# Patient Record
Sex: Female | Born: 1995 | Race: White | Hispanic: No | Marital: Married
Health system: Southern US, Community
[De-identification: ages and names within clinical notes are randomized; demographics above are authoritative.]

---

## 2019-09-05 ENCOUNTER — Emergency Department (HOSPITAL_COMMUNITY)

## 2019-09-05 ENCOUNTER — Other Ambulatory Visit: Payer: Self-pay

## 2019-09-05 ENCOUNTER — Emergency Department (HOSPITAL_COMMUNITY)
Admission: EM | Admit: 2019-09-05 | Discharge: 2019-09-06 | Disposition: A | Discharge status: Home / Self Care | Attending: Emergency Medicine | Admitting: Emergency Medicine

## 2019-09-05 DIAGNOSIS — Y92000 Kitchen of unspecified non-institutional (private) residence as  the place of occurrence of the external cause: Secondary | ICD-10-CM | POA: Insufficient documentation

## 2019-09-05 DIAGNOSIS — Y93G1 Activity, food preparation and clean up: Secondary | ICD-10-CM | POA: Diagnosis not present

## 2019-09-05 DIAGNOSIS — Z23 Encounter for immunization: Secondary | ICD-10-CM | POA: Insufficient documentation

## 2019-09-05 DIAGNOSIS — S61211A Laceration without foreign body of left index finger without damage to nail, initial encounter: Secondary | ICD-10-CM | POA: Insufficient documentation

## 2019-09-05 DIAGNOSIS — Y999 Unspecified external cause status: Secondary | ICD-10-CM | POA: Diagnosis not present

## 2019-09-05 DIAGNOSIS — Y281XXA Contact with knife, undetermined intent, initial encounter: Secondary | ICD-10-CM | POA: Insufficient documentation

## 2019-09-05 MED ORDER — TETANUS-DIPHTH-ACELL PERTUSSIS 5-2.5-18.5 LF-MCG/0.5 IM SUSP
0.5000 mL | Freq: Once | INTRAMUSCULAR | Status: AC
  Administered 2019-09-05: 0.5 mL via INTRAMUSCULAR
  Filled 2019-09-05: qty 0.5

## 2019-09-05 MED ORDER — BACITRACIN ZINC 500 UNIT/GM EX OINT
TOPICAL_OINTMENT | Freq: Once | CUTANEOUS | Status: AC
  Administered 2019-09-05: 1 via TOPICAL
  Filled 2019-09-05: qty 0.9

## 2019-09-05 MED ORDER — LIDOCAINE HCL (PF) 1 % IJ SOLN
10.0000 mL | Freq: Once | INTRAMUSCULAR | Status: AC
  Administered 2019-09-05: 10 mL via INTRADERMAL
  Filled 2019-09-05: qty 10

## 2019-09-05 NOTE — ED Provider Notes (Signed)
[facility] EMERGENCY DEPARTMENT Provider Note   CSN: [id number] Arrival date & time: 09/05/19  2048     History Chief Complaint  Patient presents with  . Finger Injury    Joan Hanson is a 24 y.o. female who presents for evaluation of left index finger laceration that occurred just prior to arrival. She reports she was cutting an avocado and the knife slipped and cut her finger. She is not on any blood thinners.  She denies any numbness/weakness.  The history is provided by the patient.       No past medical history on file.  There are no problems to display for this patient.   The histories are not reviewed yet. Please review them in the "History" navigator section and refresh this SmartLink.   OB History   No obstetric history on file.     No family history on file.  Social History   Tobacco Use  . Smoking status: Not on file  Substance Use Topics  . Alcohol use: Not on file  . Drug use: Not on file    Home Medications Prior to Admission medications   Medication Sig Start Date End Date Taking? Authorizing Provider  Prenatal Vit-Fe Fumarate-FA (PRENATAL PO) Take 1 tablet by mouth at bedtime.   Yes [provider]    Allergies    Patient has no known allergies.  Review of Systems   Review of Systems  Skin: Positive for wound.  Neurological: Negative for weakness and numbness.  All other systems reviewed and are negative.   Physical Exam Updated Vital Signs BP (!) 145/78 (BP Location: Right Arm)   Pulse 80   Temp 97.9 F (36.6 C) (Oral)   Resp 18   SpO2 99%   Physical Exam Vitals and nursing note reviewed.  Constitutional:      Appearance: She is well-developed.  HENT:     Head: Normocephalic and atraumatic.  Eyes:     General: No scleral icterus.       Right eye: No discharge.        Left eye: No discharge.     Conjunctiva/sclera: Conjunctivae normal.  Cardiovascular:     Pulses:          Radial pulses  are 2+ on the right side and 2+ on the left side.  Pulmonary:     Effort: Pulmonary effort is normal.  Musculoskeletal:     Comments: Flexion/tension to the left index finger without any difficulty.  Full flexion and extension of the PIP and DIP intact with any difficulty.  She can easily make a fist.  Skin:    General: Skin is warm and dry.     Comments: Small 2 cm v shaped laceration noted to the volar and lateral aspect of the left index finger that is in between MCP and PIP. It does not cross over the joint.   Neurological:     Mental Status: She is alert.     Comments: Sensation intact along major nerve distributions of BUE  Psychiatric:        Speech: Speech normal.        Behavior: Behavior normal.     ED Results / Procedures / Treatments   Labs (all labs ordered are listed, but only abnormal results are displayed) Labs Reviewed - No data to display  EKG None  Radiology No results found.  Procedures .Marland KitchenLaceration Repair  Date/Time: 09/05/2019 10:52 PM Performed by: Maxwell Caul, PA-C Authorized by:  Volanda Napoleon, PA-C   Consent:    Consent obtained:  Verbal   Consent given by:  Patient   Risks discussed:  Infection, need for additional repair, pain, poor cosmetic result and poor wound healing   Alternatives discussed:  No treatment and delayed treatment Universal protocol:    Procedure explained and questions answered to patient or proxy's satisfaction: yes     Relevant documents present and verified: yes     Test results available and properly labeled: yes     Imaging studies available: yes     Required blood products, implants, devices, and special equipment available: yes     Site/side marked: yes     Immediately prior to procedure, a time out was called: yes     Patient identity confirmed:  Verbally with patient Anesthesia (see MAR for exact dosages):    Anesthesia method:  Local infiltration   Local anesthetic:  Lidocaine 1% w/o epi Laceration  details:    Location:  Finger   Finger location:  L index finger   Length (cm):  2 Repair type:    Repair type:  Intermediate Pre-procedure details:    Preparation:  Patient was prepped and draped in usual sterile fashion Exploration:    Hemostasis achieved with:  Direct pressure   Wound exploration: wound explored through full range of motion     Wound extent: no foreign bodies/material noted   Treatment:    Area cleansed with:  Betadine   Amount of cleaning:  Extensive   Irrigation solution:  Sterile saline   Irrigation method:  Syringe Skin repair:    Repair method:  Sutures   Suture size:  4-0   Suture material:  Prolene   Suture technique:  Simple interrupted   Number of sutures:  4 Approximation:    Approximation:  Close Post-procedure details:    Dressing:  Antibiotic ointment   Patient tolerance of procedure:  Tolerated well, no immediate complications   (including critical care time)  Medications Ordered in ED Medications  lidocaine (PF) (XYLOCAINE) 1 % injection 10 mL (10 mLs Intradermal Given by Other 09/05/19 2320)  Tdap (BOOSTRIX) injection 0.5 mL (0.5 mLs Intramuscular Given 09/05/19 2323)  bacitracin ointment (1 application Topical Given 09/05/19 2323)    ED Course  I have reviewed the triage vital signs and the nursing notes.  Pertinent labs & imaging results that were available during my care of the patient were reviewed by me and considered in my medical decision making (see chart for details).    MDM Rules/Calculators/A&P                      24 y.o. F who presents for evaluation of laceration to left index finger that occurred today.  She reports she was cutting an avocado when the knife slipped and caught her index finger.  She does not know if her tetanus is up-to-date. Patient is afebrile, non-toxic appearing, sitting comfortably on examination table. Vital signs reviewed and stable.  Patient is neurovascularly intact.  Patient has a 2 cm V-shaped  wound noted to the left index finger between MCP and DIP.  She has full range of motion without any difficulty.  We will plan for wound care, repair.  Procedure as documented above.  Patient tolerated procedure well.  On evaluation, abdomen showed to be a little deeper than initially flat.  I did not see any evidence of foreign body and she had full range of motion of  DIP and PIP so do not suspect tendon injury.  We will get x-ray to evaluate.  X-ray reviewed.  No acute bony abnormality.    Discussed results with patient.   Patient encouraged home supportive care measures. At this time, patient exhibits no emergent life-threatening condition that require further evaluation in ED. Patient had ample opportunity for questions and discussion. All patient's questions were answered with full understanding. Strict return precautions discussed. Patient expresses understanding and agreement to plan.   Portions of this note were generated with Scientist, clinical (histocompatibility and immunogenetics). Dictation errors may occur despite best attempts at proofreading.   Final Clinical Impression(s) / ED Diagnoses Final diagnoses:  Laceration of left index finger without foreign body without damage to nail, initial encounter    Rx / DC Orders ED Discharge Orders    None       Rosana Hoes 09/05/19 2344    Blane Ohara, MD 09/06/19 [phone]

## 2019-09-05 NOTE — ED Triage Notes (Signed)
Pt was cooking and cut her left index finger at proximately 8 PM this evening.

## 2019-09-05 NOTE — Discharge Instructions (Signed)
Keep the wound clean and dry for the first 24 hours. After that you may gently clean the wound with soap and water. Make sure to pat dry the wound before covering it with any dressing. You can use topical antibiotic ointment and bandage. Ice and elevate for pain relief.  ° °You can take Tylenol or Ibuprofen as directed for pain. You can alternate Tylenol and Ibuprofen every 4 hours for additional pain relief.  ° °Return to the Emergency Department, your primary care doctor, or the [facility] in 5-7 days for suture removal.  ° °Monitor closely for any signs of infection. Return to the Emergency Department for any worsening redness/swelling of the area that begins to spread, drainage from the site, worsening pain, fever or any other worsening or concerning symptoms.  ° ° °

## 2019-09-06 NOTE — ED Notes (Signed)
Discharge instructions reviewed with pt. Pt verbalized understanding.   

## 2020-10-25 IMAGING — DX DG FINGER INDEX 2+V*L*
3 series · 3 of 3 positions shown · non-contrast
Comparison: None.

CLINICAL DATA: Finger laceration

EXAM:
LEFT INDEX FINGER 2+V

[finger ap]
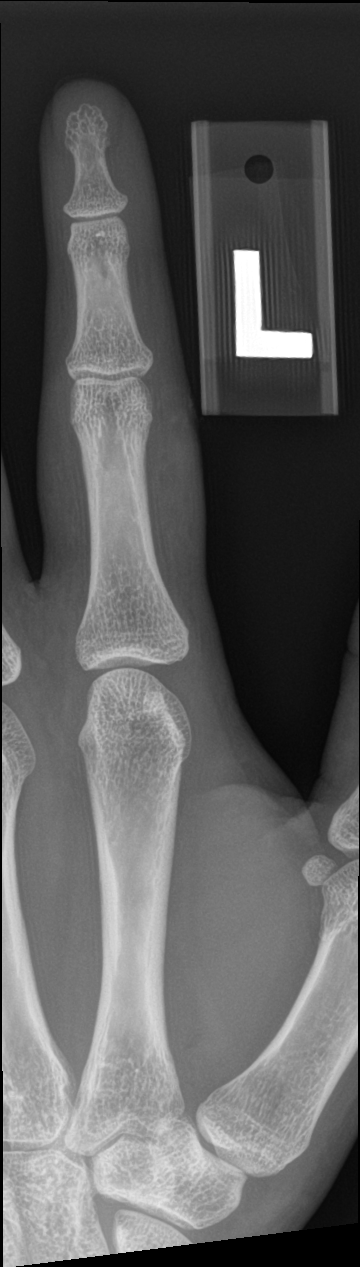

[finger obl]
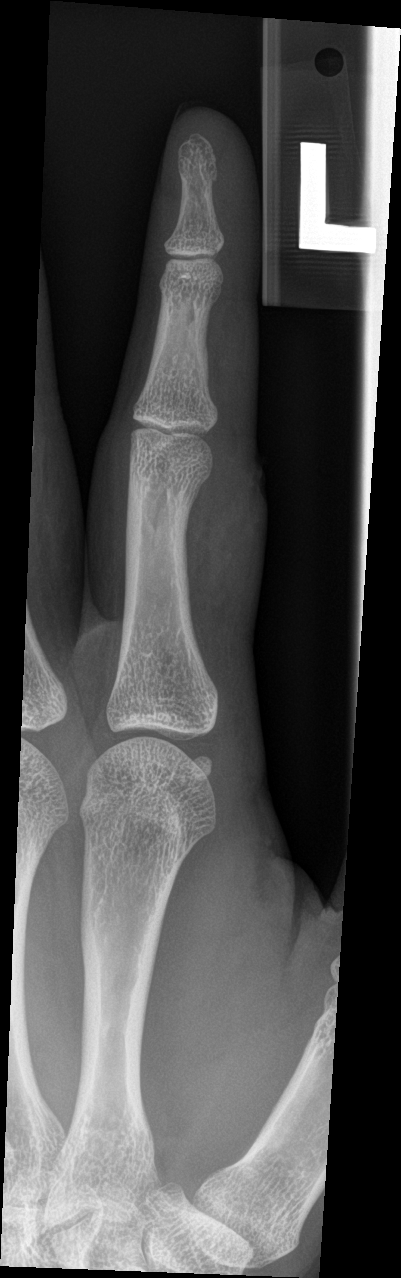

[finger lat]
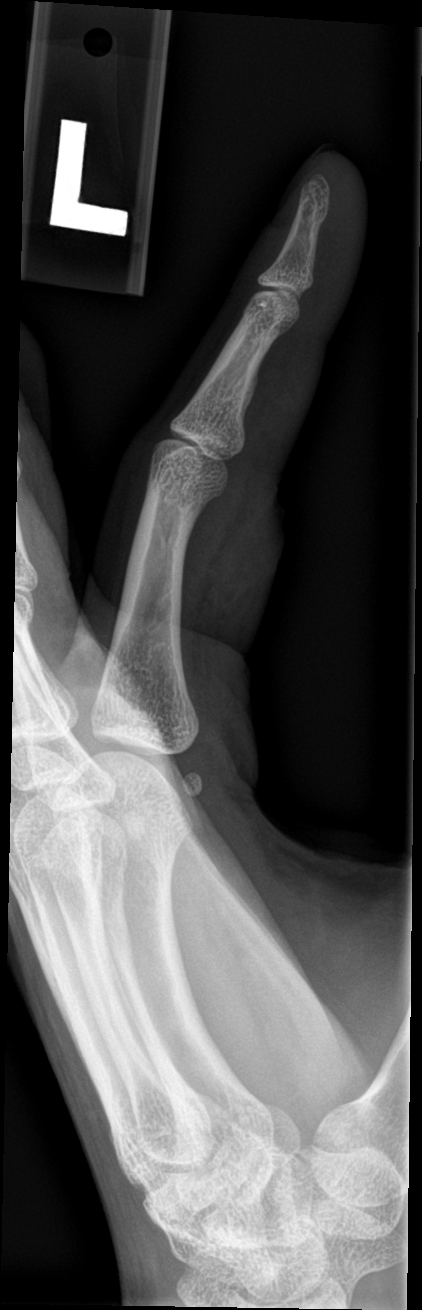

[3 of 3 positions shown; findings below may reference images not displayed]

FINDINGS: No fracture, subluxation or dislocation. No radiopaque foreign
bodies. Joint spaces maintained.
IMPRESSION: No fracture or foreign body.
